# Patient Record
Sex: Male | Born: 1995 | Race: White | Hispanic: No | Marital: Single | Smoking: Never smoker
Health system: Southern US, Community
[De-identification: ages and names within clinical notes are randomized; demographics above are authoritative.]

## PROBLEM LIST (undated history)

## (undated) HISTORY — PX: KNEE SURGERY: SHX244

## (undated) HISTORY — PX: SURGERY OF LIP: SUR1315

---
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
## 2019-03-07 ENCOUNTER — Encounter: Payer: Self-pay | Admitting: Emergency Medicine

## 2019-03-07 ENCOUNTER — Other Ambulatory Visit: Payer: Self-pay

## 2019-03-07 ENCOUNTER — Emergency Department: Payer: 59

## 2019-03-07 ENCOUNTER — Emergency Department
Admission: EM | Admit: 2019-03-07 | Discharge: 2019-03-08 | Disposition: A | Discharge status: Home / Self Care | Payer: 59 | Attending: Emergency Medicine | Admitting: Emergency Medicine

## 2019-03-07 DIAGNOSIS — Y990 Civilian activity done for income or pay: Secondary | ICD-10-CM | POA: Diagnosis not present

## 2019-03-07 DIAGNOSIS — S61031A Puncture wound without foreign body of right thumb without damage to nail, initial encounter: Secondary | ICD-10-CM

## 2019-03-07 DIAGNOSIS — S60351A Superficial foreign body of right thumb, initial encounter: Secondary | ICD-10-CM | POA: Diagnosis not present

## 2019-03-07 DIAGNOSIS — Y929 Unspecified place or not applicable: Secondary | ICD-10-CM | POA: Insufficient documentation

## 2019-03-07 DIAGNOSIS — Y939 Activity, unspecified: Secondary | ICD-10-CM | POA: Diagnosis not present

## 2019-03-07 DIAGNOSIS — W458XXA Other foreign body or object entering through skin, initial encounter: Secondary | ICD-10-CM | POA: Insufficient documentation

## 2019-03-07 MED ORDER — LIDOCAINE HCL (PF) 1 % IJ SOLN
5.0000 mL | Freq: Once | INTRAMUSCULAR | Status: AC
  Administered 2019-03-08: 5 mL via INTRADERMAL
  Filled 2019-03-07: qty 5

## 2019-03-07 NOTE — ED Triage Notes (Signed)
Pt presents to ED with splinter embedded in his right hand. Pt states he has been unable to get it out on his own. No bleeding or drainage present at this time.

## 2019-03-07 NOTE — ED Provider Notes (Signed)
[facility] Emergency Department Provider Note  ____________________________________________   First MD Initiated Contact with Patient 03/07/19 2342     (approximate)  I have reviewed the triage vital signs and the nursing notes.   HISTORY  Chief Complaint Foreign Body in Skin    HPI Tracy Abbott is a 24 y.o. male with below list of previous medical conditions presents to the emergency department secondary to a "splinter" in the right thumb.  Patient states this occurred while at work as he was moving wood.  Patient unsure if splinter still present in his finger or not.  Pain score is 3 out of 10.        History reviewed. No pertinent past medical history.  There are no problems to display for this patient.   Past Surgical History:  Procedure Laterality Date  . KNEE SURGERY    . SURGERY OF LIP      Prior to Admission medications   Not on File    Allergies Patient has no known allergies.  No family history on file.  Social History Social History   Tobacco Use  . Smoking status: Never Smoker  . Smokeless tobacco: Never Used  Substance Use Topics  . Alcohol use: Never  . Drug use: Never    Review of Systems Constitutional: No fever/chills Eyes: No visual changes. ENT: No sore throat. Cardiovascular: Denies chest pain. Respiratory: Denies shortness of breath. Gastrointestinal: No abdominal pain.  No nausea, no vomiting.  No diarrhea.  No constipation. Genitourinary: Negative for dysuria. Musculoskeletal: Negative for neck pain.  Negative for back pain. Integumentary: Negative for rash.  Positive for splinter in the right thumb Neurological: Negative for headaches, focal weakness or numbness.   ____________________________________________   PHYSICAL EXAM:  VITAL SIGNS: ED Triage Vitals  Enc Vitals Group     BP 03/07/19 2239 124/78     Pulse Rate 03/07/19 2239 70     Resp 03/07/19 2239 16     Temp 03/07/19 2239  98.3 F (36.8 C)     Temp Source 03/07/19 2239 Oral     SpO2 03/07/19 2239 100 %     Weight 03/07/19 2240 72.6 kg (160 lb)     Height 03/07/19 2240 1.88 m (6\' 2" )     Head Circumference --      Peak Flow --      Pain Score 03/07/19 2240 3     Pain Loc --      Pain Edu? --      Excl. in [place]? --     Constitutional: Alert and oriented.  Eyes: Conjunctivae are normal.  Mouth/Throat: Patient is wearing a mask. Neck: No stridor.  No meningeal signs.   Musculoskeletal: No lower extremity tenderness nor edema. No gross deformities of extremities. Neurologic:  Normal speech and language. No gross focal neurologic deficits are appreciated.  Skin: Single puncture wound noted palmar aspect of the right thumb Psychiatric: Mood and affect are normal. Speech and behavior are normal.  ____________________________________________   LABS (all labs ordered are listed, but only abnormal results are displayed)  RADIOLOGY I, [place], personally viewed and evaluated these images (plain radiographs) as part of my medical decision making, as well as reviewing the written report by the radiologist.  ED MD interpretation: Negative right hand x-ray  Official radiology report(s): DG Hand 2 View Right  Result Date: 03/07/2019 CLINICAL DATA:  Splinter in the right hand EXAM: RIGHT HAND - 2 VIEW COMPARISON:  None. FINDINGS: There is no evidence of fracture or dislocation. There is no evidence of arthropathy or other focal bone abnormality. Soft tissues are unremarkable. IMPRESSION: Negative.  No definitive radiopaque foreign body is visualized. Electronically Signed   By: Jasmine Pang M.D.   On: 03/07/2019 23:22      .Foreign Body Removal  Date/Time: 03/08/2019 1:39 AM Performed by: Darci Current, MD Authorized by: Darci Current, MD  Consent: Verbal consent obtained. Consent given by: patient Patient understanding: patient states understanding of the procedure being performed Body  area: skin General location: upper extremity Location details: right thumb Anesthesia: digital block  Anesthesia: Local Anesthetic: lidocaine 2% without epinephrine Anesthetic total: 5 mL  Sedation: Patient sedated: no  Patient restrained: no Patient cooperative: yes Tendon involvement: none 1 objects recovered. Objects recovered: Splinter     ____________________________________________   INITIAL IMPRESSION / MDM / ASSESSMENT AND PLAN / ED COURSE  As part of my medical decision making, I reviewed the following data within the electronic MEDICAL RECORD NUMBER  24 year old with above-stated history and physical exam secondary to a foreign body in the right thumb which was removed without difficulty please refer to procedure note above regarding specifics.  Patient given Augmentin in the emergency department will be prescribed the same for home.  ____________________________________________  FINAL CLINICAL IMPRESSION(S) / ED DIAGNOSES  Final diagnoses:  Puncture wound of right thumb, initial encounter     MEDICATIONS GIVEN DURING THIS VISIT:  Medications  lidocaine (PF) (XYLOCAINE) 1 % injection 5 mL (has no administration in time range)     ED Discharge Orders    None      *Please note:  Tracy Abbott was evaluated in Emergency Department on 03/07/2019 for the symptoms described in the history of present illness. He was evaluated in the context of the global COVID-19 pandemic, which necessitated consideration that the patient might be at risk for infection with the SARS-CoV-2 virus that causes COVID-19. Institutional protocols and algorithms that pertain to the evaluation of patients at risk for COVID-19 are in a state of rapid change based on information released by regulatory bodies including the CDC and federal and state organizations. These policies and algorithms were followed during the patient's care in the ED.  Some ED evaluations and interventions may be  delayed as a result of limited staffing during the pandemic.*  Note:  This document was prepared using Dragon voice recognition software and may include unintentional dictation errors.   Darci Current, MD 03/08/19 [phone]

## 2019-03-08 MED ORDER — AMOXICILLIN-POT CLAVULANATE 875-125 MG PO TABS: 1.0000 | ORAL_TABLET | Freq: Two times a day (BID) | ORAL | 0 refills | Status: AC

## 2019-03-08 MED ORDER — AMOXICILLIN-POT CLAVULANATE 875-125 MG PO TABS
1.0000 | ORAL_TABLET | Freq: Once | ORAL | Status: AC
  Administered 2019-03-08: 1 via ORAL
  Filled 2019-03-08: qty 1

## 2019-03-08 MED ORDER — BACITRACIN ZINC 500 UNIT/GM EX OINT
TOPICAL_OINTMENT | Freq: Once | CUTANEOUS | Status: AC
  Filled 2019-03-08: qty 0.9

## 2020-11-17 IMAGING — CR DG HAND 2V*R*
2 series · 2 of 2 positions shown · non-contrast
Comparison: None.

CLINICAL DATA: Splinter in the right hand

EXAM:
RIGHT HAND - 2 VIEW

[hand ap]
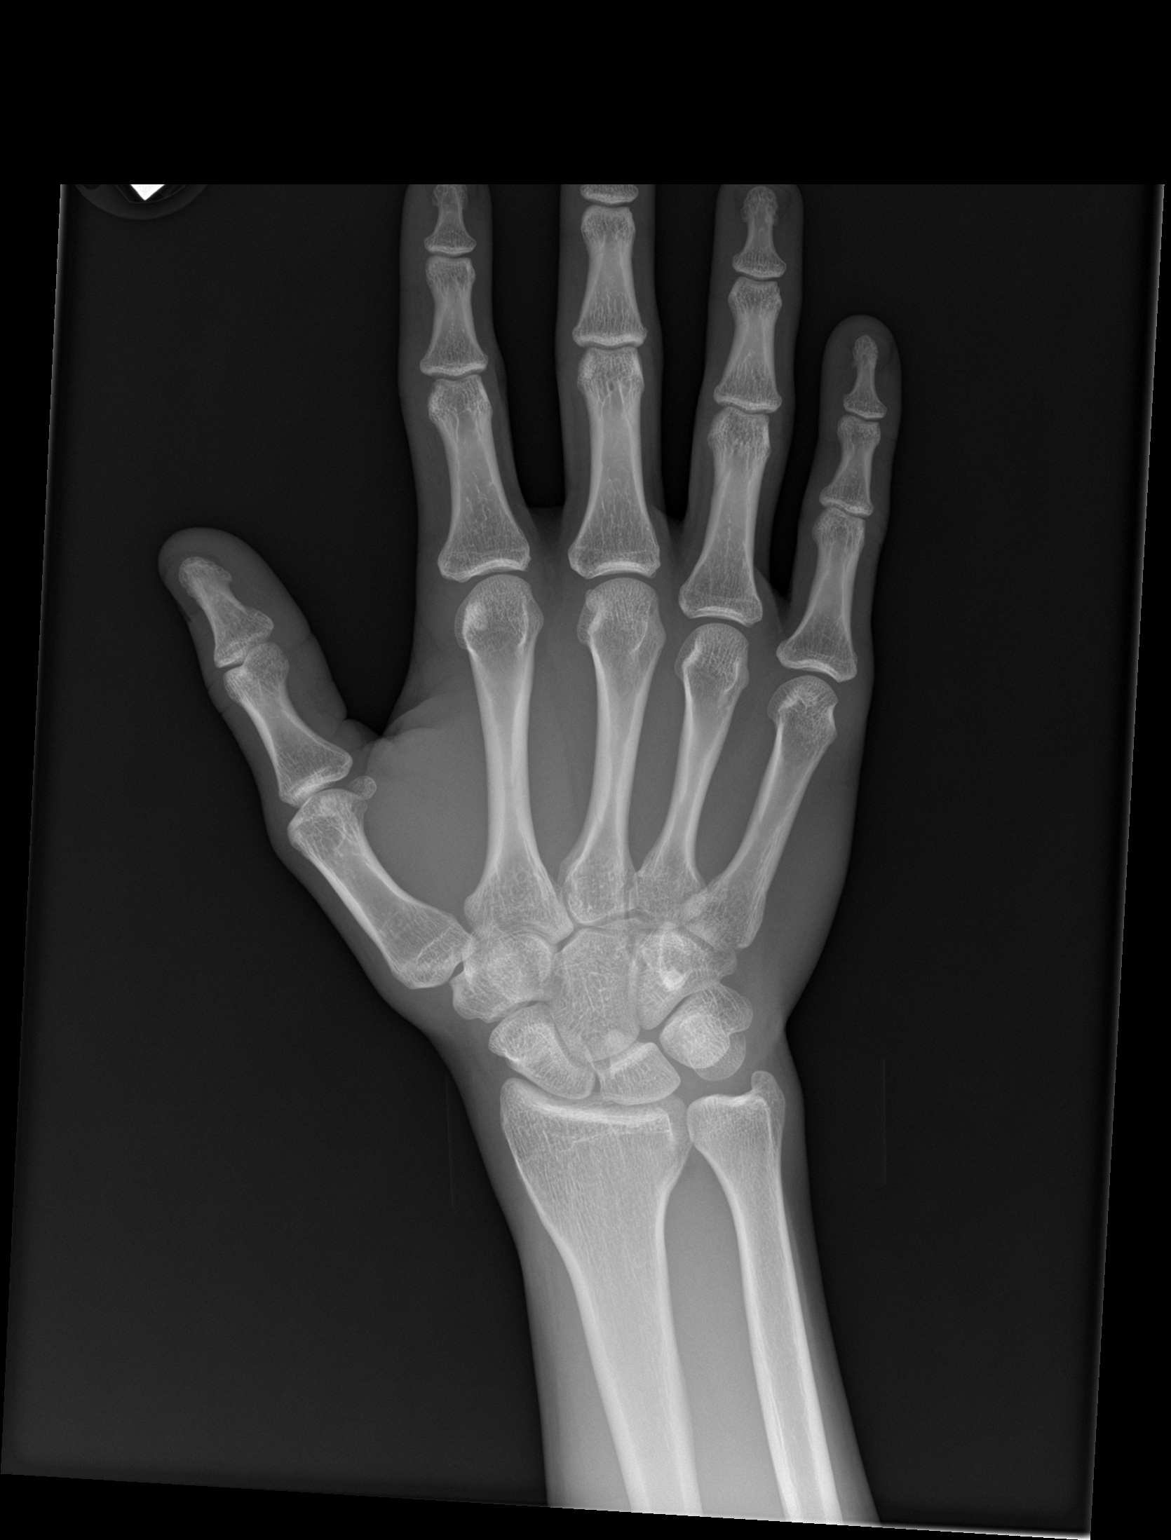

[hand lat]
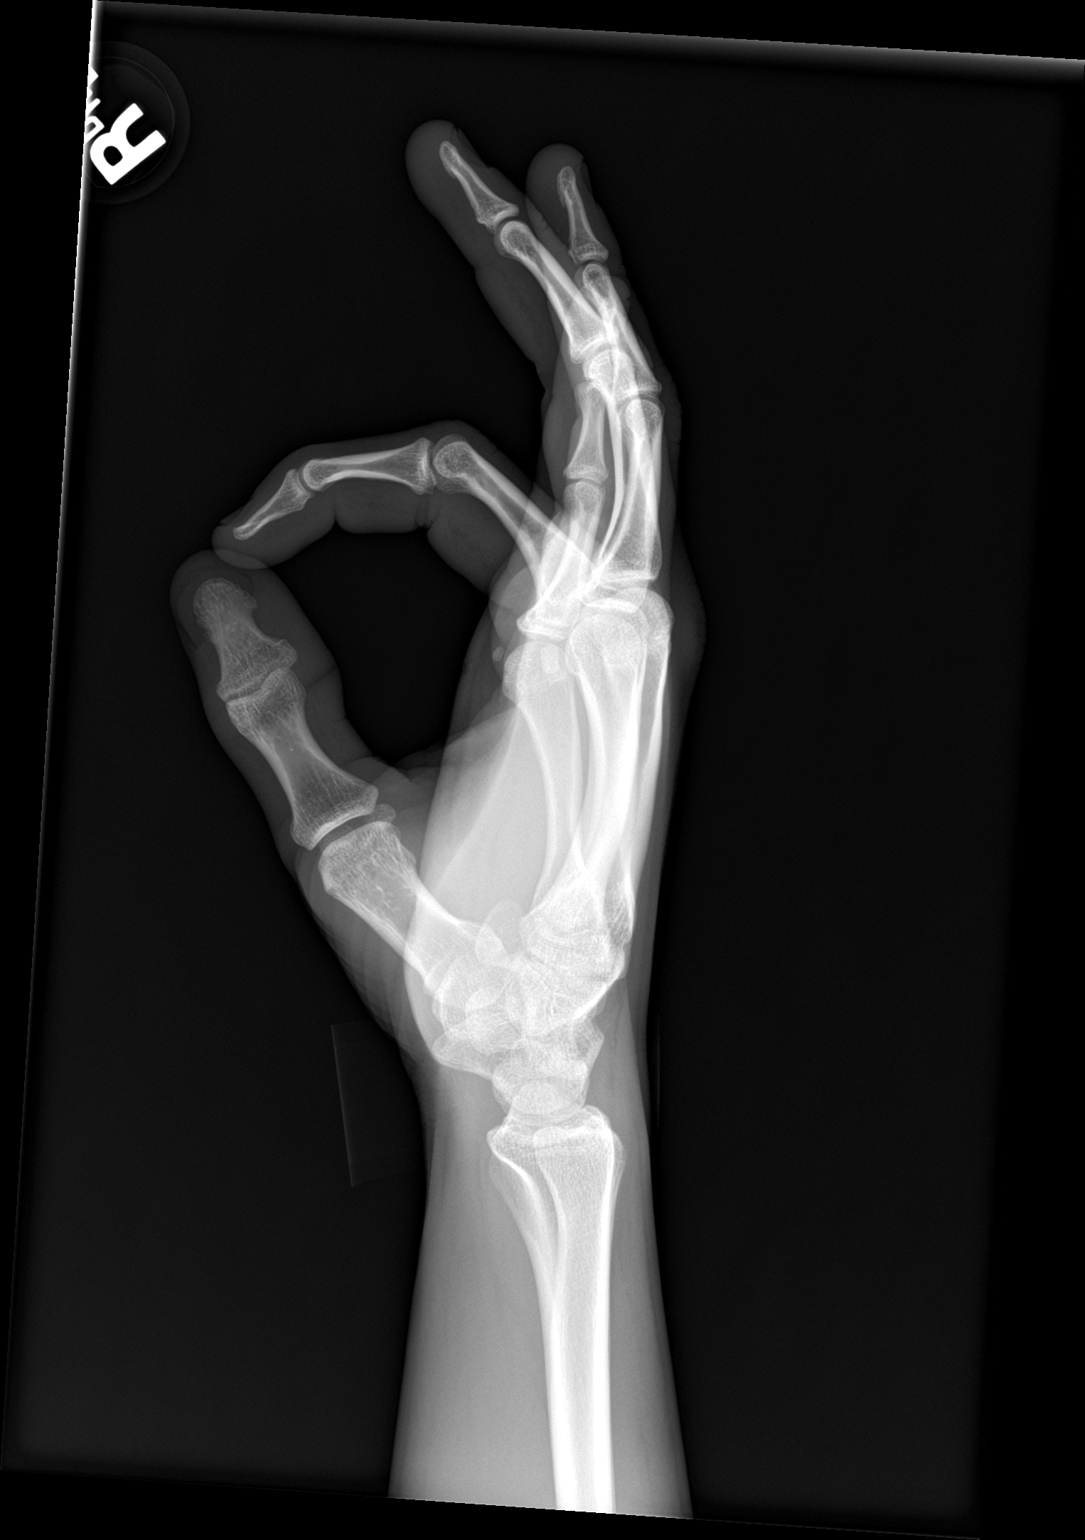

[2 of 2 positions shown; findings below may reference images not displayed]

FINDINGS: There is no evidence of fracture or dislocation. There is no
evidence of arthropathy or other focal bone abnormality. Soft
tissues are unremarkable.
IMPRESSION: Negative.  No definitive radiopaque foreign body is visualized.

## 2020-11-26 ENCOUNTER — Emergency Department
Admission: EM | Admit: 2020-11-26 | Discharge: 2020-11-26 | Disposition: A | Discharge status: Home / Self Care | Payer: PRIVATE HEALTH INSURANCE | Attending: Emergency Medicine | Admitting: Emergency Medicine

## 2020-11-26 ENCOUNTER — Other Ambulatory Visit: Payer: Self-pay

## 2020-11-26 DIAGNOSIS — S61112A Laceration without foreign body of left thumb with damage to nail, initial encounter: Secondary | ICD-10-CM | POA: Diagnosis not present

## 2020-11-26 DIAGNOSIS — W260XXA Contact with knife, initial encounter: Secondary | ICD-10-CM | POA: Insufficient documentation

## 2020-11-26 DIAGNOSIS — S6992XA Unspecified injury of left wrist, hand and finger(s), initial encounter: Secondary | ICD-10-CM | POA: Diagnosis present

## 2020-11-26 NOTE — ED Provider Notes (Signed)
[facility] Emergency Department Provider Note   ____________________________________________    I have reviewed the triage vital signs and the nursing notes.   HISTORY  Chief Complaint Laceration     HPI Tracy Abbott is a 25 y.o. male who presents with a laceration to his left thumb.  Patient reports he was using a knife and it slipped and he accidentally sliced the tip of his left thumb off.  He went to urgent care and referred him here because of continued bleeding, no other injuries, tetanus is up-to-date  No past medical history on file.  There are no problems to display for this patient.   Past Surgical History:  Procedure Laterality Date   KNEE SURGERY     SURGERY OF LIP      Prior to Admission medications   Not on File     Allergies Patient has no known allergies.  No family history on file.  Social History Social History   Tobacco Use   Smoking status: Never   Smokeless tobacco: Never  Vaping Use   Vaping Use: Every day  Substance Use Topics   Alcohol use: Never   Drug use: Never    Review of Systems  Constitutional: No dizziness     Musculoskeletal: As above Skin: Injury as above     ____________________________________________   PHYSICAL EXAM:  VITAL SIGNS: ED Triage Vitals  Enc Vitals Group     BP 11/26/20 1652 136/77     Pulse Rate 11/26/20 1652 91     Resp 11/26/20 1652 16     Temp 11/26/20 1652 98.3 F (36.8 C)     Temp Source 11/26/20 1652 Oral     SpO2 11/26/20 1652 99 %     Weight 11/26/20 1653 74.8 kg (165 lb)     Height 11/26/20 1653 1.88 m (6\' 2" )     Head Circumference --      Peak Flow --      Pain Score 11/26/20 1652 4     Pain Loc --      Pain Edu? --      Excl. in [place]? --      Constitutional: Alert and oriented. No acute distress. Pleasant and interactive Eyes: Conjunctivae are normal.  Head: Atraumatic. Nose: No congestion/rhinnorhea. Mouth/Throat: Mucous membranes  are moist.   Cardiovascular: Normal rate, regular rhythm.   Musculoskeletal: No lower extremity tenderness nor edema.   Neurologic:  Normal speech and language. No gross focal neurologic deficits are appreciated.   Skin:  Skin is warm, dry.  Left hand: Patient has sliced off the lateral aspect of his distal thumb, small bleeder noted, nailbed is partially affected   ____________________________________________   LABS (all labs ordered are listed, but only abnormal results are displayed)  Labs Reviewed - No data to display ____________________________________________  EKG   ____________________________________________  RADIOLOGY   ____________________________________________   PROCEDURES  Procedure(s) performed: No  Procedures   Critical Care performed: No ____________________________________________   INITIAL IMPRESSION / ASSESSMENT AND PLAN / ED COURSE  Pertinent labs & imaging results that were available during my care of the patient were reviewed by me and considered in my medical decision making (see chart for details).   Hemostasis achieved with hemoclot gauze.  No repair necessary.  Recommend daily dressing changes, pressure if continued bleeding.   ____________________________________________   FINAL CLINICAL IMPRESSION(S) / ED DIAGNOSES  Final diagnoses:  Laceration of left thumb without foreign body with damage  to nail, initial encounter      NEW MEDICATIONS STARTED DURING THIS VISIT:  There are no discharge medications for this patient.    Note:  This document was prepared using Dragon voice recognition software and may include unintentional dictation errors.    Jene Every, MD 11/26/20 1924

## 2020-11-26 NOTE — ED Notes (Signed)
This RN spoke with the pt's boss, Tracy Abbott of [employer] at [phone] States yes to a urine drug screen

## 2020-11-26 NOTE — ED Triage Notes (Addendum)
Pt states that he was cutting open a box at work and states that he he cut the tip the top of his left thumb off, pt was sent from [facility], provider at [facility] told him that he cut an artery

## 2023-12-07 ENCOUNTER — Ambulatory Visit: Admitting: Family Medicine

## 2024-02-14 ENCOUNTER — Ambulatory Visit

## 2024-02-14 ENCOUNTER — Other Ambulatory Visit: Payer: Self-pay

## 2024-02-14 VITALS — BP 124/80 | Ht 74.0 in | Wt 183.4 lb

## 2024-02-14 DIAGNOSIS — K529 Noninfective gastroenteritis and colitis, unspecified: Secondary | ICD-10-CM

## 2024-02-14 DIAGNOSIS — R109 Unspecified abdominal pain: Secondary | ICD-10-CM | POA: Diagnosis not present

## 2024-02-14 DIAGNOSIS — K649 Unspecified hemorrhoids: Secondary | ICD-10-CM

## 2024-02-14 NOTE — Progress Notes (Signed)
 "   New Patient Visit   Physician: Sybel Standish A Deeann Servidio, MD  Patient: Tracy Abbott   DOB: 11/26/1995   28 y.o. Male  MRN: [id number] Visit Date: 02/14/2024   Chief Complaint  Patient presents with   Establish Care   Subjective  Tracy Abbott is a 29 y.o. male who presents today as a new patient to establish care.   HPI  Discussed the use of AI scribe software for clinical note transcription with the patient, who gave verbal consent to proceed.  History of Present Illness   Tracy LOPEZGARCIA is a 29 year old male who presents with chronic anal bleeding and hemorrhoids.  Rectal bleeding and hemorrhoidal symptoms - Chronic bright red blood in the toilet during bowel movements for several months - Hemorrhoidal flare-ups are unpredictable and symptoms have become more constant over time - Significant discomfort during flare-ups, impacting ability to wear normal clothes and sit comfortably - Trial of suppositories (Preparation H) and stool softeners without significant relief   Altered bowel habits and gastrointestinal symptoms - Bowel movements fluctuate between constipation and normal, with occasional diarrhea - Bloating, abdominal cramping, and chronic abdominal discomfort - Sensation of incomplete evacuation after bowel movements - Urgency and occasional mucus in the stool - Gassiness present - No fever or weight loss  Relevant family history - Grandmother with colon cancer  Lifestyle factors - Works third shift in the transportation department - Vapes - Occasionally drinks alcohol - No cigarette smoking or illicit drug use  Other medical history - Three prior ACL reconstructions - Bone marrow transplant - Facial reconstruction following a dog bite at age 14         ASSESSMENT & PLAN  Encounter Diagnoses  Name Primary?   Abdominal pain, unspecified abdominal location Yes   Chronic diarrhea    Hemorrhoids, unspecified hemorrhoid type     Orders Placed  This Encounter  Procedures   CALPROTECTIN   Iron, TIBC and Ferritin Panel   C-reactive protein   TSH + free T4   CBC with Differential/Platelet   Comprehensive metabolic panel with GFR   Hemoglobin A1c   Lipid panel   Urinalysis, Routine w reflex microscopic    Assessment and Plan   #1 chronic hemorrhoids with hematochezia.  Patient not finding improvement in symptoms with conservative treatment.  He may find that use of loperamide and regulation of bowel movements abides relief.  If not, further follow-up with gastroenterology.  2.  Chronic abdominal pain with diarrhea and urgency.  Suspect irritable bowel syndrome.  Differential including inflammatory bowel disease.  Will order basic workup, CRP fecal calprotectin.  Abdominal exam today unremarkable.  Discussed with patient loose use of loperamide to reduce diarrhea frequency and urgency.  He is careful with diet and has modified his diet to a great degree from symptom improvement.  Follow-up in 3 weeks to determine if we have improvement, review labs      Objective  BP 124/80 (BP Location: Left Arm, Patient Position: Sitting, Cuff Size: Normal)   Ht 6' 2 (1.88 m)   Wt 183 lb 6 oz (83.2 kg)   BMI 23.54 kg/m      Review of Systems  Constitutional:  Negative for chills, fever and weight loss.  Eyes:  Negative for blurred vision. h Respiratory:  Negative for cough and shortness of breath.   Cardiovascular:  Negative for chest pain and palpitations.  Skin:  Negative for rash.  Psychiatric/Behavioral:  Negative for depression. The patient  is not nervous/anxious.      Physical Exam Physical Exam Vitals reviewed.  Constitutional:      Appearance: Normal appearance. Well-developed with normal weight.  HENT:     Head: Normocephalic and atraumatic.  Normal mucous membranes, no oral lesions Eyes:     Pupils: Pupils are equal, round, and reactive to light.  Neck:     Thyroid: No thyroid mass or thyromegaly.   Cardiovascular:     Rate and Rhythm: Normal rate and regular rhythm. Normal heart sounds. Normal peripheral pulses Pulmonary:     Normal breath sounds with normal effort Abdominal:   Abdomen is soft, without tenderness or noted hepatosplenomegaly Musculoskeletal:        General: No swelling or edema  Lymphadenopathy:     Cervical: No cervical adenopathy.  Skin:    General: Skin is warm and dry without noticeable rash. Neurological:     General: No focal deficit present.  Psychiatric:        Mood and Affect: Mood, behavior and cognition normal   History reviewed. No pertinent past medical history. Past Surgical History:  Procedure Laterality Date   KNEE SURGERY     SURGERY OF LIP     Family Status  Relation Name Status   Mother  Alive   Father  Alive  No partnership data on file   History reviewed. No pertinent family history. Social History   Socioeconomic History   Marital status: Single    Spouse name: Not on file   Number of children: Not on file   Years of education: Not on file   Highest education level: Not on file  Occupational History   Not on file  Tobacco Use   Smoking status: Never   Smokeless tobacco: Never  Vaping Use   Vaping status: Every Day  Substance and Sexual Activity   Alcohol use: Never   Drug use: Never   Sexual activity: Yes    Birth control/protection: Condom  Other Topics Concern   Not on file  Social History Narrative   Not on file   Social Drivers of Health   Tobacco Use: Low Risk (02/14/2024)   Patient History    Smoking Tobacco Use: Never    Smokeless Tobacco Use: Never    Passive Exposure: Not on file  Financial Resource Strain: Not on file  Food Insecurity: Not on file  Transportation Needs: Not on file  Physical Activity: Not on file  Stress: Not on file  Social Connections: Not on file  Depression (PHQ2-9): Low Risk (02/14/2024)   Depression (PHQ2-9)    PHQ-2 Score: 3  Alcohol Screen: Not on file  Housing: Not on  file  Utilities: Not on file  Health Literacy: Not on file   Show/hide medication list[1] Allergies[2]   There is no immunization history on file for this patient.  Health Maintenance  Topic Date Due   HPV VACCINES (1 - Male 3-dose series) Never done   HIV Screening  Never done   Hepatitis C Screening  Never done   DTaP/Tdap/Td (1 - Tdap) Never done   Hepatitis B Vaccines 19-59 Average Risk (1 of 3 - 19+ 3-dose series) Never done   Influenza Vaccine  Never done   COVID-19 Vaccine (1 - 2025-26 season) Never done   Pneumococcal Vaccine  Aged Out   Meningococcal B Vaccine  Aged Out    Patient Care Team: Pa, [facility] as PCP - General  Depression Screen    02/14/2024  10:19 AM  PHQ 2/9 Scores  PHQ - 2 Score 0  PHQ- 9 Score 3     Parris DELENA Juneau, MD  [facility] [phone] (phone) [phone] (fax)  [facility]    [1]  No outpatient medications prior to visit.   No facility-administered medications prior to visit.  [2] No Known Allergies  "

## 2024-02-15 LAB — CBC WITH DIFFERENTIAL/PLATELET
Absolute Lymphocytes: 1921 {cells}/uL (ref 850–3900)
Absolute Monocytes: 441 {cells}/uL (ref 200–950)
Basophils Absolute: 20 {cells}/uL (ref 0–200)
Basophils Relative: 0.4 %
Eosinophils Absolute: 69 {cells}/uL (ref 15–500)
Eosinophils Relative: 1.4 %
HCT: 44.6 % (ref 39.4–51.1)
Hemoglobin: 15.3 g/dL (ref 13.2–17.1)
MCH: 29.9 pg (ref 27.0–33.0)
MCHC: 34.3 g/dL (ref 31.6–35.4)
MCV: 87.3 fL (ref 81.4–101.7)
MPV: 9.7 fL (ref 7.5–12.5)
Monocytes Relative: 9 %
Neutro Abs: 2450 {cells}/uL (ref 1500–7800)
Neutrophils Relative %: 50 %
Platelets: 287 Thousand/uL (ref 140–400)
RBC: 5.11 Million/uL (ref 4.20–5.80)
RDW: 12.7 % (ref 11.0–15.0)
Total Lymphocyte: 39.2 %
WBC: 4.9 Thousand/uL (ref 3.8–10.8)

## 2024-02-15 LAB — LIPID PANEL
Cholesterol: 166 mg/dL
HDL: 33 mg/dL — ABNORMAL LOW
LDL Cholesterol (Calc): 92 mg/dL
Non-HDL Cholesterol (Calc): 133 mg/dL — ABNORMAL HIGH
Total CHOL/HDL Ratio: 5 (calc) — ABNORMAL HIGH
Triglycerides: 286 mg/dL — ABNORMAL HIGH

## 2024-02-15 LAB — URINALYSIS, ROUTINE W REFLEX MICROSCOPIC
Bilirubin Urine: NEGATIVE
Glucose, UA: NEGATIVE
Hgb urine dipstick: NEGATIVE
Ketones, ur: NEGATIVE
Leukocytes,Ua: NEGATIVE
Nitrite: NEGATIVE
Protein, ur: NEGATIVE
Specific Gravity, Urine: 1.013 (ref 1.001–1.035)
pH: 5.5 (ref 5.0–8.0)

## 2024-02-15 LAB — IRON,TIBC AND FERRITIN PANEL
%SAT: 25 % (ref 20–48)
Ferritin: 38 ng/mL (ref 38–380)
Iron: 91 ug/dL (ref 50–195)
TIBC: 366 ug/dL (ref 250–425)

## 2024-02-15 LAB — COMPREHENSIVE METABOLIC PANEL WITH GFR
AG Ratio: 2 (calc) (ref 1.0–2.5)
ALT: 17 U/L (ref 9–46)
AST: 18 U/L (ref 10–40)
Albumin: 4.7 g/dL (ref 3.6–5.1)
Alkaline phosphatase (APISO): 49 U/L (ref 36–130)
BUN: 10 mg/dL (ref 7–25)
CO2: 27 mmol/L (ref 20–32)
Calcium: 9.4 mg/dL (ref 8.6–10.3)
Chloride: 103 mmol/L (ref 98–110)
Creat: 0.97 mg/dL (ref 0.60–1.24)
Globulin: 2.4 g/dL (ref 1.9–3.7)
Glucose, Bld: 97 mg/dL (ref 65–139)
Potassium: 3.7 mmol/L (ref 3.5–5.3)
Sodium: 140 mmol/L (ref 135–146)
Total Bilirubin: 0.4 mg/dL (ref 0.2–1.2)
Total Protein: 7.1 g/dL (ref 6.1–8.1)
eGFR: 110 mL/min/1.73m2

## 2024-02-15 LAB — TSH+FREE T4: TSH W/REFLEX TO FT4: 2.07 m[IU]/L (ref 0.40–4.50)

## 2024-02-15 LAB — C-REACTIVE PROTEIN: CRP: <3 mg/L

## 2024-02-15 LAB — HEMOGLOBIN A1C
Hgb A1c MFr Bld: 5.1 %
Mean Plasma Glucose: 100 mg/dL
eAG (mmol/L): 5.5 mmol/L

## 2024-02-28 LAB — CALPROTECTIN: Calprotectin: 12 ug/g

## 2024-03-06 ENCOUNTER — Ambulatory Visit

## 2024-03-06 ENCOUNTER — Ambulatory Visit: Admitting: Internal Medicine

## 2024-03-06 ENCOUNTER — Ambulatory Visit: Payer: Self-pay

## 2024-03-06 VITALS — BP 90/70 | HR 107 | Ht 74.0 in | Wt 180.0 lb

## 2024-03-06 DIAGNOSIS — J069 Acute upper respiratory infection, unspecified: Secondary | ICD-10-CM | POA: Insufficient documentation

## 2024-03-06 DIAGNOSIS — K921 Melena: Secondary | ICD-10-CM | POA: Diagnosis not present

## 2024-03-06 MED ORDER — GUAIFENESIN-CODEINE 100-10 MG/5ML PO SOLN: 10.0000 mL | Freq: Three times a day (TID) | ORAL | 0 refills | Status: AC | PRN

## 2024-03-06 NOTE — Progress Notes (Signed)
 "    Acute Patient Visit  Physician: Janeya Deyo A Setsuko Robins, MD  Patient: Tracy Abbott MRN: [id number] DOB: October 22, 1995 PCP: [facility]     Subjective:   Chief Complaint  Patient presents with   Follow-up    Patient is concerned about  having flu.     HPI: The patient is a 29 y.o. male who presents today for:   Discussed the use of AI scribe software for clinical note transcription with the patient, who gave verbal consent to proceed.  History of Present Illness   Tracy Abbott is a 29 year old male who presents with flu-like symptoms and chronic rectal bleeding.  Upper respiratory symptoms - Flu-like symptoms began Saturday after a Friday night shift at work - Initial sore throat progressed to fever and chills by Saturday morning - Fever not measured but subjectively present - No nausea or vomiting - Cough has intensified, causing gagging and producing mucus, persisting - Flu symptoms are subsiding, but cough persists - Able to tolerate fluids and food - Using over-the-counter medications and honey for symptomatic relief - Has not received a flu shot this season - Currently not working, taking time off to recover  - POCT influenza negative  Rectal bleeding - Chronic rectal bleeding present for several months - Bleeding is unpredictable and sometimes voluminous - Uncertain etiology, suspects possible hemorrhoids - Family history of colon cancer - Upcoming appointment with GI specialist for further evaluation      I personally reviewed and interpreted the patient labs today.   Labs generally unremarkable   ROS:   As noted in the HPI    ASSESMENT/PLAN:  Encounter Diagnoses  Name Primary?   URTI (acute upper respiratory infection) Yes   Hematochezia     No orders of the defined types were placed in this encounter.   Assessment and Plan    Acute upper respiratory infection Negative for influenza. Symptoms suggest viral etiology causing  airway irritation. - Prescribed cough medicine with codeine . - Advised rest and hydration. - Provided work note for Tuesday and Thursday nights off. - Instructed to seek further evaluation if shortness of breath or new symptoms develop.  Chronic rectal bleeding, possible hemorrhoids Chronic rectal bleeding with family history of colon cancer. Differential includes hemorrhoids, vascular malformations, or diverticuli. Colonoscopy recommended to rule out other causes. - Proceed with scheduled GI appointment for further evaluation and potential colonoscopy.    OBJECTIVE: Vitals:   03/06/24 1120  BP: 90/70  Pulse: (!) 107  SpO2: 98%  Weight: 180 lb (81.6 kg)  Height: 6' 2 (1.88 m)    Body mass index is 23.11 kg/m. Recent Results (from the past 2160 hours)  Iron, TIBC and Ferritin Panel     Status: None   Collection Time: 02/14/24 11:00 AM  Result Value Ref Range   Iron 91 50 - 195 mcg/dL   TIBC 633 749 - 574 mcg/dL (calc)   %SAT 25 20 - 48 % (calc)   Ferritin 38 38 - 380 ng/mL  C-reactive protein     Status: None   Collection Time: 02/14/24 11:00 AM  Result Value Ref Range   CRP <3.0 <8.0 mg/L  TSH + free T4     Status: None   Collection Time: 02/14/24 11:00 AM  Result Value Ref Range   TSH W/REFLEX TO FT4 2.07 0.40 - 4.50 mIU/L  CBC with Differential/Platelet     Status: None   Collection Time: 02/14/24 11:00 AM  Result  Value Ref Range   WBC 4.9 3.8 - 10.8 Thousand/uL   RBC 5.11 4.20 - 5.80 Million/uL   Hemoglobin 15.3 13.2 - 17.1 g/dL   HCT 55.3 60.5 - 48.8 %   MCV 87.3 81.4 - 101.7 fL   MCH 29.9 27.0 - 33.0 pg   MCHC 34.3 31.6 - 35.4 g/dL   RDW 87.2 88.9 - 84.9 %   Platelets 287 140 - 400 Thousand/uL   MPV 9.7 7.5 - 12.5 fL   Neutro Abs 2,450 1,500 - 7,800 cells/uL   Absolute Lymphocytes 1,921 850 - 3,900 cells/uL   Absolute Monocytes 441 200 - 950 cells/uL   Eosinophils Absolute 69 15 - 500 cells/uL   Basophils Absolute 20 0 - 200 cells/uL   Neutrophils  Relative % 50 %   Total Lymphocyte 39.2 %   Monocytes Relative 9.0 %   Eosinophils Relative 1.4 %   Basophils Relative 0.4 %  Comprehensive metabolic panel with GFR     Status: None   Collection Time: 02/14/24 11:00 AM  Result Value Ref Range   Glucose, Bld 97 65 - 139 mg/dL    Comment: .        Non-fasting reference interval .    BUN 10 7 - 25 mg/dL   Creat 9.02 9.39 - 8.75 mg/dL   eGFR 889 > OR = 60 fO/fpw/8.26f7   BUN/Creatinine Ratio SEE NOTE: 6 - 22 (calc)    Comment:    Not Reported: BUN and Creatinine are within    reference range. .    Sodium 140 135 - 146 mmol/L   Potassium 3.7 3.5 - 5.3 mmol/L   Chloride 103 98 - 110 mmol/L   CO2 27 20 - 32 mmol/L   Calcium 9.4 8.6 - 10.3 mg/dL   Total Protein 7.1 6.1 - 8.1 g/dL   Albumin 4.7 3.6 - 5.1 g/dL   Globulin 2.4 1.9 - 3.7 g/dL (calc)   AG Ratio 2.0 1.0 - 2.5 (calc)   Total Bilirubin 0.4 0.2 - 1.2 mg/dL   Alkaline phosphatase (APISO) 49 36 - 130 U/L   AST 18 10 - 40 U/L   ALT 17 9 - 46 U/L  Hemoglobin A1c     Status: None   Collection Time: 02/14/24 11:00 AM  Result Value Ref Range   Hgb A1c MFr Bld 5.1 <5.7 %    Comment: For the purpose of screening for the presence of diabetes: . <5.7%       Consistent with the absence of diabetes 5.7-6.4%    Consistent with increased risk for diabetes             (prediabetes) > or =6.5%  Consistent with diabetes . This assay result is consistent with a decreased risk of diabetes. . Currently, no consensus exists regarding use of hemoglobin A1c for diagnosis of diabetes in children. . According to American Diabetes Association (ADA) guidelines, hemoglobin A1c <7.0% represents optimal control in non-pregnant diabetic patients. Different metrics may apply to specific patient populations.  Standards of Medical Care in Diabetes(ADA). .    Mean Plasma Glucose 100 mg/dL   eAG (mmol/L) 5.5 mmol/L  Lipid panel     Status: Abnormal   Collection Time: 02/14/24 11:00 AM   Result Value Ref Range   Cholesterol 166 <200 mg/dL   HDL 33 (L) > OR = 40 mg/dL   Triglycerides 713 (H) <150 mg/dL    Comment: . If a non-fasting specimen was collected, consider repeat triglyceride testing  on a fasting specimen if clinically indicated.  Veatrice et al. J. of Clin. Lipidol. 2015;9:129-169. SABRA    LDL Cholesterol (Calc) 92 mg/dL (calc)    Comment: Reference range: <100 . Desirable range <100 mg/dL for primary prevention;   <70 mg/dL for patients with CHD or diabetic patients  with > or = 2 CHD risk factors. SABRA LDL-C is now calculated using the Martin-Hopkins  calculation, which is a validated novel method providing  better accuracy than the Friedewald equation in the  estimation of LDL-C.  Gladis APPLETHWAITE et al. SANDREA. 7986;689(80): 2061-2068  (http://education.[facility].com/faq/FAQ164)    Total CHOL/HDL Ratio 5.0 (H) <5.0 (calc)   Non-HDL Cholesterol (Calc) 133 (H) <130 mg/dL (calc)    Comment: For patients with diabetes plus 1 major ASCVD risk  factor, treating to a non-HDL-C goal of <100 mg/dL  (LDL-C of <29 mg/dL) is considered a therapeutic  option.   Urinalysis, Routine w reflex microscopic     Status: None   Collection Time: 02/14/24 11:00 AM  Result Value Ref Range   Color, Urine YELLOW YELLOW   APPearance CLEAR CLEAR   Specific Gravity, Urine 1.013 1.001 - 1.035   pH 5.5 5.0 - 8.0   Glucose, UA NEGATIVE NEGATIVE   Bilirubin Urine NEGATIVE NEGATIVE   Ketones, ur NEGATIVE NEGATIVE   Hgb urine dipstick NEGATIVE NEGATIVE   Protein, ur NEGATIVE NEGATIVE   Nitrite NEGATIVE NEGATIVE   Leukocytes,Ua NEGATIVE NEGATIVE  CALPROTECTIN     Status: None   Collection Time: 02/21/24  2:44 PM   Specimen: STOOL  Result Value Ref Range   Calprotectin 12 mcg/g    Comment:                                       Reference Range:                                       <50     Normal                                       50-120  Borderline                                        >120    Elevated . Calprotectin in Crohn's disease and ulcerative colitis can be five to several thousand times above the reference population (50 mcg/g or less). Levels are usually 50 mcg/g or less in healthy patients and with irritable bowel syndrome. Repeat testing in 4-6 weeks is suggested for borderline values.       Physical Exam Vitals reviewed.  Constitutional:      Appearance: Normal appearance. Well-developed with normal weight.  Cardiovascular:     Rate and Rhythm: Normal rate and regular rhythm. Normal heart sounds. Normal peripheral pulses Pulmonary:     Normal breath sounds with normal effort Skin:    General: Skin is warm and dry without noticeable rash. Neurological:     General: No focal deficit present.  Psychiatric:        Mood and Affect: Mood, behavior and cognition normal  Allergies Patient has no known allergies.  Past Medical History Patient  has no past medical history on file.  Surgical History Patient  has a past surgical history that includes Knee surgery and Surgery of lip.  Family History Pateint's family history is not on file.  Social History Patient  reports that he has never smoked. He has never used smokeless tobacco. He reports that he does not drink alcohol and does not use drugs.    03/06/2024 "

## 2024-03-06 NOTE — Telephone Encounter (Signed)
 FYI Only or Action Required?: FYI only for provider: appointment scheduled on 03/06/24.  Patient was last seen in primary care on 02/14/2024 by Zafirov, Clarissa A, MD.  Called Nurse Triage reporting Cough, Wheezing, and Fatigue.  Symptoms began several days ago.  Interventions attempted: Other: unsure.  Symptoms are: unchanged.  Triage Disposition: See HCP Within 4 Hours (Or PCP Triage)  Patient/caregiver understands and will follow disposition?: Yes                                  Onset: Saturday Symptoms: productive cough (green/yellow mucous), generalized weakness, chest pains mainly when coughing that worsen when going outside in cold weather, wheezing Denies: denies difficulty breathing, fever at this time, exposure to flu/COVID Patient able to speak in clear and complete sentences while on phone with this RN.     Patient was supposed to have an appointment at 11:40 AM, but office called to reschedule. This RN advised in-person evaluation today for symptoms. This RN called [facility] to determine why office had called to reschedule. This RN spoke to [place] and was then transferred to Dr. Zafirov. This RN was advised by MD that patient can attend appointment, as scheduled. This RN advised patient and patient will come in this morning, as scheduled.   Reason for Disposition  Wheezing is present  Protocols used: Cough - Acute Productive-A-AH   Reason for Triage: Flu symptoms, fever / sweating /  / chest pain / discolored mucous / sob

## 2024-03-27 ENCOUNTER — Ambulatory Visit
